# Patient Record
Sex: Male | Born: 1985 | Race: Black or African American | Hispanic: No | Marital: Single | Smoking: Never smoker
Health system: Southern US, Community
[De-identification: ages and names within clinical notes are randomized; demographics above are authoritative.]

## PROBLEM LIST (undated history)

## (undated) HISTORY — PX: HERNIA REPAIR: SHX51

---

## 2011-05-07 ENCOUNTER — Encounter: Payer: Self-pay | Admitting: *Deleted

## 2011-05-07 ENCOUNTER — Emergency Department (HOSPITAL_BASED_OUTPATIENT_CLINIC_OR_DEPARTMENT_OTHER)
Admission: EM | Admit: 2011-05-07 | Discharge: 2011-05-07 | Disposition: A | Discharge status: Home / Self Care | Payer: Self-pay | Attending: Emergency Medicine | Admitting: Emergency Medicine

## 2011-05-07 DIAGNOSIS — R112 Nausea with vomiting, unspecified: Secondary | ICD-10-CM | POA: Insufficient documentation

## 2011-05-07 DIAGNOSIS — R51 Headache: Secondary | ICD-10-CM | POA: Insufficient documentation

## 2011-05-07 MED ORDER — METOCLOPRAMIDE HCL 5 MG/ML IJ SOLN
10.0000 mg | Freq: Once | INTRAMUSCULAR | Status: AC
  Administered 2011-05-07: 10 mg via INTRAMUSCULAR
  Filled 2011-05-07: qty 2

## 2011-05-07 MED ORDER — DIPHENHYDRAMINE HCL 50 MG/ML IJ SOLN
25.0000 mg | Freq: Once | INTRAMUSCULAR | Status: AC
  Administered 2011-05-07: 25 mg via INTRAMUSCULAR
  Filled 2011-05-07: qty 1

## 2011-05-07 MED ORDER — KETOROLAC TROMETHAMINE 60 MG/2ML IM SOLN
60.0000 mg | Freq: Once | INTRAMUSCULAR | Status: AC
Start: 2011-05-07 — End: 2011-05-07
  Administered 2011-05-07: 60 mg via INTRAMUSCULAR
  Filled 2011-05-07: qty 2

## 2011-05-07 NOTE — ED Notes (Signed)
Pt c/o " migraine" x 1 day with n/v light sensativity

## 2011-05-07 NOTE — ED Provider Notes (Signed)
History     CSN: [id number] Arrival date & time: 05/07/2011  5:34 PM   First MD Initiated Contact with Patient 05/07/11 1749      Chief Complaint  Patient presents with  . Migraine    (Consider location/radiation/quality/duration/timing/severity/associated sxs/prior treatment) HPI Comments: Pt has history of similar symptoms:pt has not taken anything  Patient is a 25 y.o. male presenting with migraine. The history is provided by the patient. No language interpreter was used.  Migraine This is a recurrent problem. The current episode started today. The problem occurs constantly. The problem has been unchanged. Associated symptoms include nausea and vomiting. Pertinent negatives include no visual change. He has tried nothing for the symptoms.  Migraine This is a recurrent problem. The current episode started today. The problem occurs constantly. The problem has been unchanged. He has tried nothing for the symptoms.    Past Medical History  Diagnosis Date  . Migraine     Past Surgical History  Procedure Date  . Hernia repair     History reviewed. No pertinent family history.  History  Substance Use Topics  . Smoking status: Not on file  . Smokeless tobacco: Not on file  . Alcohol Use:       Review of Systems  Gastrointestinal: Positive for nausea and vomiting.  All other systems reviewed and are negative.    Allergies  Review of patient's allergies indicates no known allergies.  Home Medications  No current outpatient prescriptions on file.  BP 130/97  Pulse 65  Temp(Src) 98.7 F (37.1 C) (Oral)  Resp 16  Ht 6\' 3"  (1.905 m)  Wt 250 lb (113.399 kg)  BMI 31.25 kg/m2  SpO2 100%  Physical Exam  Nursing note and vitals reviewed. Constitutional: He is oriented to person, place, and time. He appears well-developed and well-nourished.  HENT:  Head: Normocephalic and atraumatic.  Right Ear: External ear normal.  Left Ear: External ear normal.    Mouth/Throat: Oropharynx is clear and moist.  Eyes: Conjunctivae and EOM are normal. Pupils are equal, round, and reactive to light.  Neck: Normal range of motion. Neck supple.  Cardiovascular: Normal rate and regular rhythm.   Pulmonary/Chest: Effort normal and breath sounds normal.  Abdominal: Soft. Bowel sounds are normal.  Musculoskeletal: Normal range of motion.  Neurological: He is alert and oriented to person, place, and time.  Skin: Skin is warm and dry.  Psychiatric: He has a normal mood and affect.    ED Course  Procedures (including critical care time)  Labs Reviewed - No data to display No results found.   1. Headache       MDM  Pt is feeling better and is ready to go home at this time   Medical screening examination/treatment/procedure(s) were performed by non-physician practitioner and as supervising physician I was immediately available for consultation/collaboration. Osvaldo Human, M.D.     Teressa Lower, NP 05/07/11 1924  Carleene Cooper III, MD 05/08/11 [phone]

## 2011-07-24 ENCOUNTER — Emergency Department (HOSPITAL_BASED_OUTPATIENT_CLINIC_OR_DEPARTMENT_OTHER)
Admission: EM | Admit: 2011-07-24 | Discharge: 2011-07-24 | Disposition: A | Discharge status: Home / Self Care | Payer: Self-pay | Attending: Emergency Medicine | Admitting: Emergency Medicine

## 2011-07-24 ENCOUNTER — Encounter (HOSPITAL_BASED_OUTPATIENT_CLINIC_OR_DEPARTMENT_OTHER): Payer: Self-pay | Admitting: *Deleted

## 2011-07-24 DIAGNOSIS — L0201 Cutaneous abscess of face: Secondary | ICD-10-CM | POA: Insufficient documentation

## 2011-07-24 DIAGNOSIS — L03211 Cellulitis of face: Secondary | ICD-10-CM | POA: Insufficient documentation

## 2011-07-24 MED ORDER — SULFAMETHOXAZOLE-TRIMETHOPRIM 800-160 MG PO TABS: 1.0000 | ORAL_TABLET | Freq: Two times a day (BID) | ORAL | Status: DC

## 2011-07-24 MED ORDER — SULFAMETHOXAZOLE-TRIMETHOPRIM 800-160 MG PO TABS: 1.0000 | ORAL_TABLET | Freq: Two times a day (BID) | ORAL | Status: AC

## 2011-07-24 MED ORDER — LIDOCAINE HCL 2 % IJ SOLN
INTRAMUSCULAR | Status: AC
  Filled 2011-07-24: qty 1

## 2011-07-24 MED ORDER — CEPHALEXIN 500 MG PO CAPS: 500.0000 mg | ORAL_CAPSULE | Freq: Four times a day (QID) | ORAL | Status: AC

## 2011-07-24 NOTE — ED Provider Notes (Signed)
History     CSN: [id number]  Arrival date & time 07/24/11  1056   First MD Initiated Contact with Patient 07/24/11 1121      Chief Complaint  Patient presents with  . Abscess    (Consider location/radiation/quality/duration/timing/severity/associated sxs/prior treatment) Patient is a 26 y.o. male presenting with abscess. The history is provided by the patient.  Abscess  This is a new problem. The current episode started less than one week ago. The onset was gradual. The problem occurs continuously. The problem has been gradually worsening. The abscess is present on the face. The problem is moderate. The abscess is characterized by redness and swelling. The abscess first occurred at home.    Past Medical History  Diagnosis Date  . Migraine     Past Surgical History  Procedure Date  . Hernia repair     No family history on file.  History  Substance Use Topics  . Smoking status: Never Smoker   . Smokeless tobacco: Not on file  . Alcohol Use: Yes      Review of Systems  All other systems reviewed and are negative.    Allergies  Review of patient's allergies indicates no known allergies.  Home Medications  No current outpatient prescriptions on file.  BP 140/95  Pulse 80  Temp(Src) 97.7 F (36.5 C) (Oral)  Resp 20  SpO2 99%  Physical Exam  Nursing note and vitals reviewed. Constitutional: He is oriented to person, place, and time. He appears well-developed and well-nourished.  HENT:  Head: Normocephalic and atraumatic.  Neck: Normal range of motion. Neck supple.  Musculoskeletal: Normal range of motion.  Neurological: He is alert and oriented to person, place, and time.  Skin:       There is a 2 cm round fluctuant area to the left cheek adjacent to the corner of the mouth.    ED Course  Procedures (including critical care time)  Labs Reviewed - No data to display No results found.   No diagnosis found.    MDM  Is draining serous fluid.   Will treat with keflex, bactrim, warm soaks.        Geoffery Lyons, MD 07/24/11 1155

## 2011-07-24 NOTE — ED Notes (Signed)
Abscess on his face x 2 weeks. Has been manually squeezing it and getting pus and blood from the site but it will not go away. Hx of I&D abscess to his buttocks in the past.

## 2012-07-28 ENCOUNTER — Emergency Department (HOSPITAL_BASED_OUTPATIENT_CLINIC_OR_DEPARTMENT_OTHER): Payer: Self-pay

## 2012-07-28 ENCOUNTER — Emergency Department (HOSPITAL_BASED_OUTPATIENT_CLINIC_OR_DEPARTMENT_OTHER)
Admission: EM | Admit: 2012-07-28 | Discharge: 2012-07-29 | Disposition: A | Discharge status: Home / Self Care | Payer: Self-pay | Attending: Emergency Medicine | Admitting: Emergency Medicine

## 2012-07-28 ENCOUNTER — Encounter (HOSPITAL_BASED_OUTPATIENT_CLINIC_OR_DEPARTMENT_OTHER): Payer: Self-pay | Admitting: *Deleted

## 2012-07-28 DIAGNOSIS — Y9367 Activity, basketball: Secondary | ICD-10-CM | POA: Insufficient documentation

## 2012-07-28 DIAGNOSIS — Y9239 Other specified sports and athletic area as the place of occurrence of the external cause: Secondary | ICD-10-CM | POA: Insufficient documentation

## 2012-07-28 DIAGNOSIS — X500XXA Overexertion from strenuous movement or load, initial encounter: Secondary | ICD-10-CM | POA: Insufficient documentation

## 2012-07-28 DIAGNOSIS — Z79899 Other long term (current) drug therapy: Secondary | ICD-10-CM | POA: Insufficient documentation

## 2012-07-28 DIAGNOSIS — Y92838 Other recreation area as the place of occurrence of the external cause: Secondary | ICD-10-CM | POA: Insufficient documentation

## 2012-07-28 DIAGNOSIS — S8990XA Unspecified injury of unspecified lower leg, initial encounter: Secondary | ICD-10-CM | POA: Insufficient documentation

## 2012-07-28 DIAGNOSIS — Z8679 Personal history of other diseases of the circulatory system: Secondary | ICD-10-CM | POA: Insufficient documentation

## 2012-07-28 DIAGNOSIS — M25561 Pain in right knee: Secondary | ICD-10-CM

## 2012-07-28 MED ORDER — MELOXICAM 7.5 MG PO TABS: 15.0000 mg | ORAL_TABLET | Freq: Every day | ORAL | Status: DC

## 2012-07-28 NOTE — ED Notes (Signed)
Patient transported to X-ray 

## 2012-07-28 NOTE — ED Notes (Signed)
Pt c/o right knee injury x 2 weeks ago

## 2012-07-29 NOTE — ED Provider Notes (Signed)
History     CSN: [id number]  Arrival date & time 07/28/12  2135   First MD Initiated Contact with Patient 07/28/12 2245      Chief Complaint  Patient presents with  . Knee Injury    (Consider location/radiation/quality/duration/timing/severity/associated sxs/prior treatment) HPI Comments: 27 year old male who presents with right knee pain. He states this was acute in onset 3 weeks ago while he was playing basketball. It happened while he was running, there were no sudden movements, no returns, no jumping, no strains. The initial pain was felt behind the knee, it was felt like a pop and since that time he has had difficulty bending his knee, there is increased pain with climbing stairs and descending stairs but no significant pain while he is walking on flat surface. Initially there was some swelling but this has resolved spontaneously and there is no redness or swelling of the knee. He denies any numbness or weakness of the leg. He has not been taking any medications but initially did ice the knee. The symptoms are intermittent, worse with movement, not associated with fevers or swelling at this time. Pain is mild  The history is provided by the patient.    Past Medical History  Diagnosis Date  . Migraine     Past Surgical History  Procedure Date  . Hernia repair     History reviewed. No pertinent family history.  History  Substance Use Topics  . Smoking status: Never Smoker   . Smokeless tobacco: Not on file  . Alcohol Use: Yes      Review of Systems  Constitutional: Negative for fever.  Musculoskeletal: Negative for joint swelling ( Initial swelling resolved spontaneously).  Skin: Negative for rash and wound.  Neurological: Negative for weakness and numbness.    Allergies  Review of patient's allergies indicates no known allergies.  Home Medications   Current Outpatient Rx  Name  Route  Sig  Dispense  Refill  . MELOXICAM 7.5 MG PO TABS   Oral   Take 2  tablets (15 mg total) by mouth daily.   30 tablet   0     BP 160/83  Pulse 65  Temp 98.1 F (36.7 C) (Oral)  Resp 16  Ht 6\' 3"  (1.905 m)  Wt 260 lb (117.935 kg)  BMI 32.50 kg/m2  SpO2 100%  Physical Exam  Nursing note and vitals reviewed. Constitutional: He appears well-developed and well-nourished. No distress.  HENT:  Head: Normocephalic and atraumatic.  Eyes: Conjunctivae normal are normal. No scleral icterus.  Cardiovascular: Normal rate, regular rhythm and intact distal pulses.   Pulmonary/Chest: Effort normal and breath sounds normal.  Musculoskeletal: He exhibits tenderness ( Mild tenderness with flexion and extension of the right knee, it is a stable joint without any drawer signs, stable with medial and lateral stress). He exhibits no edema.       Able to straight leg raise without difficulty  Neurological: He is alert.  Skin: Skin is warm and dry. No rash noted. He is not diaphoretic.    ED Course  Procedures (including critical care time)  Labs Reviewed - No data to display Dg Knee Complete 4 Views Right  07/28/2012  *RADIOLOGY REPORT*  Clinical Data: Right knee pain after injury.  RIGHT KNEE - COMPLETE 4+ VIEW  Comparison: None.  Findings: There is no evidence of fracture or dislocation.  The joint spaces are preserved.  No significant degenerative change is seen; the patellofemoral joint is grossly unremarkable in appearance.  No significant joint effusion is seen.  The visualized soft tissues are normal in appearance.  IMPRESSION: No evidence of fracture or dislocation.   Original Report Authenticated By: Tonia Ghent, M.D.      1. Pain in right knee       MDM  The patient appears stable, x-rays performed in the emergency department show no signs of fractures or dislocations. I suspect that he has a poor meniscus or minor intra-articular injury. He has a stable joint and is ambulating without difficulty. He will be referred to followup with orthopedics  sports medicine with Dr. Pearletha Forge at the patient's request.  Anti-inflammatories for home.        Vida Roller, MD 07/29/12 [phone]

## 2013-08-09 ENCOUNTER — Emergency Department (HOSPITAL_BASED_OUTPATIENT_CLINIC_OR_DEPARTMENT_OTHER)
Admission: EM | Admit: 2013-08-09 | Discharge: 2013-08-09 | Disposition: A | Discharge status: Home / Self Care | Payer: Self-pay | Attending: Emergency Medicine | Admitting: Emergency Medicine

## 2013-08-09 ENCOUNTER — Encounter (HOSPITAL_BASED_OUTPATIENT_CLINIC_OR_DEPARTMENT_OTHER): Payer: Self-pay | Admitting: Emergency Medicine

## 2013-08-09 DIAGNOSIS — Z791 Long term (current) use of non-steroidal anti-inflammatories (NSAID): Secondary | ICD-10-CM | POA: Insufficient documentation

## 2013-08-09 DIAGNOSIS — Z8679 Personal history of other diseases of the circulatory system: Secondary | ICD-10-CM | POA: Insufficient documentation

## 2013-08-09 DIAGNOSIS — H109 Unspecified conjunctivitis: Secondary | ICD-10-CM | POA: Insufficient documentation

## 2013-08-09 MED ORDER — TOBRAMYCIN 0.3 % OP SOLN: 2.0000 [drp] | OPHTHALMIC | Status: DC

## 2013-08-09 NOTE — ED Notes (Signed)
C/o redness and pain to both eyes-started in right eye 4 days ago

## 2013-08-09 NOTE — ED Provider Notes (Signed)
CSN: [id number]     Arrival date & time 08/09/13  1840 History   First MD Initiated Contact with Patient 08/09/13 2050     Chief Complaint  Patient presents with  . Eye Pain   (Consider location/radiation/quality/duration/timing/severity/associated sxs/prior Treatment) Patient is a 28 y.o. male presenting with eye pain. The history is provided by the patient. No language interpreter was used.  Eye Pain This is a new problem. The current episode started today. The problem occurs constantly. The problem has been gradually worsening. Pertinent negatives include no coughing or fever. Nothing aggravates the symptoms. He has tried nothing for the symptoms. The treatment provided moderate relief.  Pt complains bilat eye redness  Past Medical History  Diagnosis Date  . Migraine    Past Surgical History  Procedure Laterality Date  . Hernia repair     No family history on file. History  Substance Use Topics  . Smoking status: Never Smoker   . Smokeless tobacco: Not on file  . Alcohol Use: Yes    Review of Systems  Constitutional: Negative for fever.  Eyes: Positive for pain.  Respiratory: Negative for cough.   All other systems reviewed and are negative.    Allergies  Review of patient's allergies indicates no known allergies.  Home Medications   Current Outpatient Rx  Name  Route  Sig  Dispense  Refill  . meloxicam (MOBIC) 7.5 MG tablet   Oral   Take 2 tablets (15 mg total) by mouth daily.   30 tablet   0   . tobramycin (TOBREX) 0.3 % ophthalmic solution   Both Eyes   Place 2 drops into both eyes every 4 (four) hours.   5 mL   0    BP 142/83  Pulse 74  Temp(Src) 98.5 F (36.9 C) (Oral)  Resp 18  Ht 6\' 3"  (1.905 m)  Wt 244 lb (110.678 kg)  BMI 30.50 kg/m2  SpO2 98% Physical Exam  Constitutional: He is oriented to person, place, and time. He appears well-developed and well-nourished.  HENT:  Head: Normocephalic and atraumatic.  Eyes:  Injected bilat  conjunctiva  Neck: Normal range of motion. Neck supple.  Cardiovascular: Normal rate.   Pulmonary/Chest: Effort normal.  Abdominal: Soft.  Musculoskeletal: Normal range of motion.  Neurological: He is alert and oriented to person, place, and time. He has normal reflexes.  Skin: Skin is warm.  Psychiatric: He has a normal mood and affect.    ED Course  Procedures (including critical care time) Labs Review Labs Reviewed - No data to display Imaging Review No results found.  EKG Interpretation   None       MDM   1. Conjunctivitis    Tobrex opth    Lonia SkinnerLeslie K Goodyears BarSofia, [place] JerseyPA-C 08/09/13 2111

## 2013-08-09 NOTE — ED Notes (Addendum)
Rt Eye redness x 4 days, left eye onset today, painful,    P;t took out contacts today

## 2013-08-09 NOTE — Discharge Instructions (Signed)
Bacterial Conjunctivitis  Bacterial conjunctivitis, commonly called pink eye, is an inflammation of the clear membrane that covers the white part of the eye (conjunctiva). The inflammation can also happen on the underside of the eyelids. The blood vessels in the conjunctiva become inflamed causing the eye to become red or pink. Bacterial conjunctivitis may spread easily from one eye to another and from person to person (contagious).   CAUSES   Bacterial conjunctivitis is caused by bacteria. The bacteria may come from your own skin, your upper respiratory tract, or from someone else with bacterial conjunctivitis.  SYMPTOMS   The normally white color of the eye or the underside of the eyelid is usually pink or red. The pink eye is usually associated with irritation, tearing, and some sensitivity to light. Bacterial conjunctivitis is often associated with a thick, yellowish discharge from the eye. The discharge may turn into a crust on the eyelids overnight, which causes your eyelids to stick together. If a discharge is present, there may also be some blurred vision in the affected eye.  DIAGNOSIS   Bacterial conjunctivitis is diagnosed by your caregiver through an eye exam and the symptoms that you report. Your caregiver looks for changes in the surface tissues of your eyes, which may point to the specific type of conjunctivitis. A sample of any discharge may be collected on a cotton-tip swab if you have a severe case of conjunctivitis, if your cornea is affected, or if you keep getting repeat infections that do not respond to treatment. The sample will be sent to a lab to see if the inflammation is caused by a bacterial infection and to see if the infection will respond to antibiotic medicines.  TREATMENT   · Bacterial conjunctivitis is treated with antibiotics. Antibiotic eyedrops are most often used. However, antibiotic ointments are also available. Antibiotics pills are sometimes used. Artificial tears or eye  washes may ease discomfort.  HOME CARE INSTRUCTIONS   · To ease discomfort, apply a cool, clean wash cloth to your eye for 10 20 minutes, 3 4 times a day.  · Gently wipe away any drainage from your eye with a warm, wet washcloth or a cotton ball.  · Wash your hands often with soap and water. Use paper towels to dry your hands.  · Do not share towels or wash cloths. This may spread the infection.  · Change or wash your pillow case every day.  · You should not use eye makeup until the infection is gone.  · Do not operate machinery or drive if your vision is blurred.  · Stop using contacts lenses. Ask your caregiver how to sterilize or replace your contacts before using them again. This depends on the type of contact lenses that you use.  · When applying medicine to the infected eye, do not touch the edge of your eyelid with the eyedrop bottle or ointment tube.  SEEK IMMEDIATE MEDICAL CARE IF:   · Your infection has not improved within 3 days after beginning treatment.  · You had yellow discharge from your eye and it returns.  · You have increased eye pain.  · Your eye redness is spreading.  · Your vision becomes blurred.  · You have a fever or persistent symptoms for more than 2 3 days.  · You have a fever and your symptoms suddenly get worse.  · You have facial pain, redness, or swelling.  MAKE SURE YOU:   · Understand these instructions.  · Will watch your   condition.  · Will get help right away if you are not doing well or get worse.  Document Released: 06/22/2005 Document Revised: 03/16/2012 Document Reviewed: 11/23/2011  [employer]® Patient Information ©2014 [employer].

## 2013-08-09 NOTE — ED Provider Notes (Signed)
Medical screening examination/treatment/procedure(s) were performed by non-physician practitioner and as supervising physician I was immediately available for consultation/collaboration.  EKG Interpretation   None         Charles B. Sheldon, MD 08/09/13 2149 

## 2015-04-09 ENCOUNTER — Emergency Department (HOSPITAL_BASED_OUTPATIENT_CLINIC_OR_DEPARTMENT_OTHER)
Admission: EM | Admit: 2015-04-09 | Discharge: 2015-04-09 | Disposition: A | Discharge status: Home / Self Care | Payer: Self-pay | Attending: Emergency Medicine | Admitting: Emergency Medicine

## 2015-04-09 ENCOUNTER — Encounter (HOSPITAL_BASED_OUTPATIENT_CLINIC_OR_DEPARTMENT_OTHER): Payer: Self-pay | Admitting: Emergency Medicine

## 2015-04-09 DIAGNOSIS — R109 Unspecified abdominal pain: Secondary | ICD-10-CM | POA: Insufficient documentation

## 2015-04-09 DIAGNOSIS — K521 Toxic gastroenteritis and colitis: Secondary | ICD-10-CM

## 2015-04-09 DIAGNOSIS — Z8679 Personal history of other diseases of the circulatory system: Secondary | ICD-10-CM | POA: Insufficient documentation

## 2015-04-09 DIAGNOSIS — Z791 Long term (current) use of non-steroidal anti-inflammatories (NSAID): Secondary | ICD-10-CM | POA: Insufficient documentation

## 2015-04-09 DIAGNOSIS — R197 Diarrhea, unspecified: Secondary | ICD-10-CM | POA: Insufficient documentation

## 2015-04-09 DIAGNOSIS — T378X5A Adverse effect of other specified systemic anti-infectives and antiparasitics, initial encounter: Secondary | ICD-10-CM | POA: Insufficient documentation

## 2015-04-09 LAB — CBC WITH DIFFERENTIAL/PLATELET
BASOS PCT: 0 %
Basophils Absolute: 0 10*3/uL (ref 0.0–0.1)
EOS ABS: 0.3 10*3/uL (ref 0.0–0.7)
Eosinophils Relative: 3 %
HEMATOCRIT: 41.2 % (ref 39.0–52.0)
Hemoglobin: 13.7 g/dL (ref 13.0–17.0)
LYMPHS ABS: 2.1 10*3/uL (ref 0.7–4.0)
LYMPHS PCT: 23 %
MCH: 26 pg (ref 26.0–34.0)
MCHC: 33.3 g/dL (ref 30.0–36.0)
MCV: 78.2 fL (ref 78.0–100.0)
MONOS PCT: 14 %
Monocytes Absolute: 1.3 10*3/uL — ABNORMAL HIGH (ref 0.1–1.0)
NEUTROS ABS: 5.5 10*3/uL (ref 1.7–7.7)
Neutrophils Relative %: 60 %
PLATELETS: 323 10*3/uL (ref 150–400)
RBC: 5.27 MIL/uL (ref 4.22–5.81)
RDW: 13.2 % (ref 11.5–15.5)
WBC: 9.2 10*3/uL (ref 4.0–10.5)

## 2015-04-09 LAB — COMPREHENSIVE METABOLIC PANEL
ALT: 17 U/L (ref 17–63)
ANION GAP: 6 (ref 5–15)
AST: 19 U/L (ref 15–41)
Albumin: 3.9 g/dL (ref 3.5–5.0)
Alkaline Phosphatase: 50 U/L (ref 38–126)
BILIRUBIN TOTAL: 0.6 mg/dL (ref 0.3–1.2)
BUN: 7 mg/dL (ref 6–20)
CO2: 26 mmol/L (ref 22–32)
Calcium: 8.9 mg/dL (ref 8.9–10.3)
Chloride: 107 mmol/L (ref 101–111)
Creatinine, Ser: 1.2 mg/dL (ref 0.61–1.24)
Glucose, Bld: 104 mg/dL — ABNORMAL HIGH (ref 65–99)
POTASSIUM: 3.8 mmol/L (ref 3.5–5.1)
Sodium: 139 mmol/L (ref 135–145)
TOTAL PROTEIN: 7.6 g/dL (ref 6.5–8.1)

## 2015-04-09 LAB — URINALYSIS, ROUTINE W REFLEX MICROSCOPIC
Bilirubin Urine: NEGATIVE
GLUCOSE, UA: NEGATIVE mg/dL
HGB URINE DIPSTICK: NEGATIVE
Ketones, ur: NEGATIVE mg/dL
NITRITE: NEGATIVE
PH: 6 (ref 5.0–8.0)
Protein, ur: NEGATIVE mg/dL
Specific Gravity, Urine: 1.015 (ref 1.005–1.030)
UROBILINOGEN UA: 0.2 mg/dL (ref 0.0–1.0)

## 2015-04-09 LAB — URINE MICROSCOPIC-ADD ON

## 2015-04-09 LAB — LIPASE, BLOOD: LIPASE: 42 U/L (ref 22–51)

## 2015-04-09 MED ORDER — ONDANSETRON HCL 4 MG/2ML IJ SOLN
4.0000 mg | Freq: Once | INTRAMUSCULAR | Status: AC
  Administered 2015-04-09: 4 mg via INTRAVENOUS
  Filled 2015-04-09: qty 2

## 2015-04-09 MED ORDER — MORPHINE SULFATE (PF) 4 MG/ML IV SOLN
6.0000 mg | Freq: Once | INTRAVENOUS | Status: AC
  Administered 2015-04-09: 6 mg via INTRAVENOUS
  Filled 2015-04-09: qty 2

## 2015-04-09 MED ORDER — SODIUM CHLORIDE 0.9 % IV BOLUS (SEPSIS)
1000.0000 mL | Freq: Once | INTRAVENOUS | Status: AC
  Administered 2015-04-09: 1000 mL via INTRAVENOUS

## 2015-04-09 NOTE — Discharge Instructions (Signed)
Please avoid taken antibiotic when not prescribed to you as it can cause diarrhea. Drink plenty of fluid and stay hydrated.  Return to ER if your condition worsen or if you have other concerns.  Diarrhea Diarrhea is frequent loose and watery bowel movements. It can cause you to feel weak and dehydrated. Dehydration can cause you to become tired and thirsty, have a dry mouth, and have decreased urination that often is dark yellow. Diarrhea is a sign of another problem, most often an infection that will not last long. In most cases, diarrhea typically lasts 2-3 days. However, it can last longer if it is a sign of something more serious. It is important to treat your diarrhea as directed by your caregiver to lessen or prevent future episodes of diarrhea. CAUSES  Some common causes include:  Gastrointestinal infections caused by viruses, bacteria, or parasites.  Food poisoning or food allergies.  Certain medicines, such as antibiotics, chemotherapy, and laxatives.  Artificial sweeteners and fructose.  Digestive disorders. HOME CARE INSTRUCTIONS  Ensure adequate fluid intake (hydration): Have 1 cup (8 oz) of fluid for each diarrhea episode. Avoid fluids that contain simple sugars or sports drinks, fruit juices, whole milk products, and sodas. Your urine should be clear or pale yellow if you are drinking enough fluids. Hydrate with an oral rehydration solution that you can purchase at pharmacies, retail stores, and online. You can prepare an oral rehydration solution at home by mixing the following ingredients together:   - tsp table salt.   tsp baking soda.   tsp salt substitute containing potassium chloride.  1  tablespoons sugar.  1 L (34 oz) of water.  Certain foods and beverages may increase the speed at which food moves through the gastrointestinal (GI) tract. These foods and beverages should be avoided and include:  Caffeinated and alcoholic beverages.  High-fiber foods, such as  raw fruits and vegetables, nuts, seeds, and whole grain breads and cereals.  Foods and beverages sweetened with sugar alcohols, such as xylitol, sorbitol, and mannitol.  Some foods may be well tolerated and may help thicken stool including:  Starchy foods, such as rice, toast, pasta, low-sugar cereal, oatmeal, grits, baked potatoes, crackers, and bagels.  Bananas.  Applesauce.  Add probiotic-rich foods to help increase healthy bacteria in the GI tract, such as yogurt and fermented milk products.  Wash your hands well after each diarrhea episode.  Only take over-the-counter or prescription medicines as directed by your caregiver.  Take a warm bath to relieve any burning or pain from frequent diarrhea episodes. SEEK IMMEDIATE MEDICAL CARE IF:   You are unable to keep fluids down.  You have persistent vomiting.  You have blood in your stool, or your stools are black and tarry.  You do not urinate in 6-8 hours, or there is only a small amount of very dark urine.  You have abdominal pain that increases or localizes.  You have weakness, dizziness, confusion, or light-headedness.  You have a severe headache.  Your diarrhea gets worse or does not get better.  You have a fever or persistent symptoms for more than 2-3 days.  You have a fever and your symptoms suddenly get worse. MAKE SURE YOU:   Understand these instructions.  Will watch your condition.  Will get help right away if you are not doing well or get worse. Document Released: 06/12/2002 Document Revised: 11/06/2013 Document Reviewed: 02/28/2012 [facility] Patient Information 2015 [place], [place]. This information is not intended to replace advice given to  you by your health care provider. Make sure you discuss any questions you have with your health care provider.

## 2015-04-09 NOTE — ED Notes (Signed)
The patient reports that he took an antibiotic about 1 weeks ago , and the patient reports that he has since had a stomach pain. The patient reports that he is able to drink but can not eat anything because he has pain.

## 2015-04-09 NOTE — ED Provider Notes (Signed)
CSN: [id number]     Arrival date & time 04/09/15  1517 History   First MD Initiated Contact with Patient 04/09/15 1530     Chief Complaint  Patient presents with  . Abdominal Pain     (Consider location/radiation/quality/duration/timing/severity/associated sxs/prior Treatment) HPI   29 year old male with history of migraine presents for evaluation of abdominal pain. Patient reports 10 days ago he developed headache, fever, and abdominal cramping. He decided to take his girlfriend's antibiotic, metronidazole for 3 days hoping it will treat his infection. Patient report his fever broke however since taking the antibiotic he has had worsening abdominal pain. He described pain as a squeezing sensation worsening with eating and also reported having recurrent diarrhea. States he has  6-8 bouts of loose stools per day and yesterday he also notice some blood mixed with the stools which concerns him. He is afraid to eat anything as it worsen his abdominal pain. He denies having had fever, headache, lightheadedness, dizziness, chest pain, difficulty breathing, back pain, dysuria, hematuria, black tarry stool, or rash. He admits to drinking alcohol but has not drank any alcohol in the past 2 weeks. He denies any recent travel. Report that his abdominal pain is mild to moderate at this time.  Past Medical History  Diagnosis Date  . Migraine    Past Surgical History  Procedure Laterality Date  . Hernia repair     History reviewed. No pertinent family history. Social History  Substance Use Topics  . Smoking status: Never Smoker   . Smokeless tobacco: None  . Alcohol Use: Yes    Review of Systems  All other systems reviewed and are negative.     Allergies  Review of patient's allergies indicates no known allergies.  Home Medications   Prior to Admission medications   Medication Sig Start Date End Date Taking? Authorizing Provider  meloxicam (MOBIC) 7.5 MG tablet Take 2 tablets (15 mg  total) by mouth daily. 07/28/12   Eber Hong, MD  tobramycin (TOBREX) 0.3 % ophthalmic solution Place 2 drops into both eyes every 4 (four) hours. 08/09/13   Lonia Skinner Sofia, PA-C   BP 140/93 mmHg  Pulse 76  Temp(Src) 98 F (36.7 C) (Oral)  Resp 16  Ht  (1.905 m)  Wt 252 lb (114.306 kg)  BMI 31.50 kg/m2  SpO2 97% Physical Exam  Constitutional: He appears well-developed and well-nourished. No distress.  HENT:  Head: Atraumatic.  Eyes: Conjunctivae are normal.  Neck: Neck supple.  Cardiovascular: Normal rate and regular rhythm.   Pulmonary/Chest: Effort normal and breath sounds normal.  Abdominal: Soft. Bowel sounds are normal. He exhibits no distension. There is tenderness (Mild diffuse abdominal tenderness without guarding or rebound tenderness. Negative Murphy sign, no pain at McBurney's point.).  Genitourinary:  No CVA tenderness  Neurological: He is alert.  Skin: No rash noted.  Psychiatric: He has a normal mood and affect.  Nursing note and vitals reviewed.   ED Course  Procedures (including critical care time)  Patient presents with recurrent diarrhea and abdominal cramping after taking metronidazole for approximately 3 days. He has not been on antibiotic for the past week. Workup initiated. Low suspicion for C. Difficile.  5:21 PM Patient is afebrile with stable normal vital sign. He is well appearing. Labs are reassuring. No diarrhea during the ER visit. Patient made aware to avoid taking antibiotic that is not prescribed directly to him. Recommend hydration and rest. Return precautions discussed.  Labs Review Labs Reviewed  CBC WITH  DIFFERENTIAL/PLATELET - Abnormal; Notable for the following:    Monocytes Absolute 1.3 (*)    All other components within normal limits  COMPREHENSIVE METABOLIC PANEL - Abnormal; Notable for the following:    Glucose, Bld 104 (*)    All other components within normal limits  URINALYSIS, ROUTINE W REFLEX MICROSCOPIC (NOT AT [facility] -  Abnormal; Notable for the following:    Leukocytes, UA TRACE (*)    All other components within normal limits  LIPASE, BLOOD  URINE MICROSCOPIC-ADD ON    Imaging Review No results found. I have personally reviewed and evaluated these images and lab results as part of my medical decision-making.   EKG Interpretation None      MDM   Final diagnoses:  Diarrhea due to drug    BP 140/93 mmHg  Pulse 76  Temp(Src) 98 F (36.7 C) (Oral)  Resp 16  Ht  (1.905 m)  Wt 252 lb (114.306 kg)  BMI 31.50 kg/m2  SpO2 97%     Fayrene Helper, PA-C 04/09/15 1722  Tilden Fossa, MD 04/09/15 1806

## 2015-05-23 ENCOUNTER — Emergency Department (HOSPITAL_COMMUNITY)
Admission: EM | Admit: 2015-05-23 | Discharge: 2015-05-23 | Discharge status: Against Medical Advice | Payer: Self-pay | Attending: Emergency Medicine | Admitting: Emergency Medicine

## 2015-05-23 ENCOUNTER — Emergency Department (HOSPITAL_COMMUNITY): Payer: Self-pay

## 2015-05-23 ENCOUNTER — Encounter (HOSPITAL_COMMUNITY): Payer: Self-pay

## 2015-05-23 DIAGNOSIS — R079 Chest pain, unspecified: Secondary | ICD-10-CM | POA: Insufficient documentation

## 2015-05-23 LAB — BASIC METABOLIC PANEL
ANION GAP: 4 — AB (ref 5–15)
BUN: 8 mg/dL (ref 6–20)
CHLORIDE: 106 mmol/L (ref 101–111)
CO2: 28 mmol/L (ref 22–32)
Calcium: 9.4 mg/dL (ref 8.9–10.3)
Creatinine, Ser: 1.14 mg/dL (ref 0.61–1.24)
Glucose, Bld: 95 mg/dL (ref 65–99)
POTASSIUM: 4.1 mmol/L (ref 3.5–5.1)
SODIUM: 138 mmol/L (ref 135–145)

## 2015-05-23 LAB — I-STAT TROPONIN, ED: Troponin i, poc: 0 ng/mL (ref 0.00–0.08)

## 2015-05-23 LAB — CBC
HEMATOCRIT: 42.8 % (ref 39.0–52.0)
HEMOGLOBIN: 14 g/dL (ref 13.0–17.0)
MCH: 26.2 pg (ref 26.0–34.0)
MCHC: 32.7 g/dL (ref 30.0–36.0)
MCV: 80 fL (ref 78.0–100.0)
Platelets: 311 10*3/uL (ref 150–400)
RBC: 5.35 MIL/uL (ref 4.22–5.81)
RDW: 13.7 % (ref 11.5–15.5)
WBC: 7.4 10*3/uL (ref 4.0–10.5)

## 2015-05-23 NOTE — ED Notes (Signed)
Pt here for right sided cramping chest pain onset yesterday around 2100. Denies N/V/D/SOB but reports pain worse with deep inspiration.

## 2015-07-31 ENCOUNTER — Emergency Department (HOSPITAL_COMMUNITY): Payer: Self-pay

## 2015-07-31 ENCOUNTER — Emergency Department (HOSPITAL_COMMUNITY)
Admission: EM | Admit: 2015-07-31 | Discharge: 2015-07-31 | Disposition: A | Discharge status: Home / Self Care | Payer: Self-pay | Attending: Emergency Medicine | Admitting: Emergency Medicine

## 2015-07-31 ENCOUNTER — Encounter (HOSPITAL_COMMUNITY): Payer: Self-pay

## 2015-07-31 DIAGNOSIS — Z8679 Personal history of other diseases of the circulatory system: Secondary | ICD-10-CM | POA: Insufficient documentation

## 2015-07-31 DIAGNOSIS — R079 Chest pain, unspecified: Secondary | ICD-10-CM | POA: Insufficient documentation

## 2015-07-31 LAB — BASIC METABOLIC PANEL
ANION GAP: 9 (ref 5–15)
BUN: 10 mg/dL (ref 6–20)
CALCIUM: 9.2 mg/dL (ref 8.9–10.3)
CO2: 22 mmol/L (ref 22–32)
Chloride: 105 mmol/L (ref 101–111)
Creatinine, Ser: 1.2 mg/dL (ref 0.61–1.24)
GLUCOSE: 103 mg/dL — AB (ref 65–99)
Potassium: 3.7 mmol/L (ref 3.5–5.1)
SODIUM: 136 mmol/L (ref 135–145)

## 2015-07-31 LAB — CBC
HCT: 39.7 % (ref 39.0–52.0)
HEMOGLOBIN: 13.1 g/dL (ref 13.0–17.0)
MCH: 25.9 pg — ABNORMAL LOW (ref 26.0–34.0)
MCHC: 33 g/dL (ref 30.0–36.0)
MCV: 78.5 fL (ref 78.0–100.0)
Platelets: 318 10*3/uL (ref 150–400)
RBC: 5.06 MIL/uL (ref 4.22–5.81)
RDW: 13.5 % (ref 11.5–15.5)
WBC: 6.9 10*3/uL (ref 4.0–10.5)

## 2015-07-31 LAB — I-STAT TROPONIN, ED: TROPONIN I, POC: 0 ng/mL (ref 0.00–0.08)

## 2015-07-31 MED ORDER — NAPROXEN 250 MG PO TABS: 250.0000 mg | ORAL_TABLET | Freq: Two times a day (BID) | ORAL | Status: DC

## 2015-07-31 NOTE — Discharge Instructions (Signed)
Nonspecific Chest Pain  °Chest pain can be caused by many different conditions. There is always a chance that your pain could be related to something serious, such as a heart attack or a blood clot in your lungs. Chest pain can also be caused by conditions that are not life-threatening. If you have chest pain, it is very important to follow up with your health care provider. °CAUSES  °Chest pain can be caused by: °· Heartburn. °· Pneumonia or bronchitis. °· Anxiety or stress. °· Inflammation around your heart (pericarditis) or lung (pleuritis or pleurisy). °· A blood clot in your lung. °· A collapsed lung (pneumothorax). It can develop suddenly on its own (spontaneous pneumothorax) or from trauma to the chest. °· Shingles infection (varicella-zoster virus). °· Heart attack. °· Damage to the bones, muscles, and cartilage that make up your chest wall. This can include: °¨ Bruised bones due to injury. °¨ Strained muscles or cartilage due to frequent or repeated coughing or overwork. °¨ Fracture to one or more ribs. °¨ Sore cartilage due to inflammation (costochondritis). °RISK FACTORS  °Risk factors for chest pain may include: °· Activities that increase your risk for trauma or injury to your chest. °· Respiratory infections or conditions that cause frequent coughing. °· Medical conditions or overeating that can cause heartburn. °· Heart disease or family history of heart disease. °· Conditions or health behaviors that increase your risk of developing a blood clot. °· Having had chicken pox (varicella zoster). °SIGNS AND SYMPTOMS °Chest pain can feel like: °· Burning or tingling on the surface of your chest or deep in your chest. °· Crushing, pressure, aching, or squeezing pain. °· Dull or sharp pain that is worse when you move, cough, or take a deep breath. °· Pain that is also felt in your back, neck, shoulder, or arm, or pain that spreads to any of these areas. °Your chest pain may come and go, or it may stay  constant. °DIAGNOSIS °Lab tests or other studies may be needed to find the cause of your pain. Your health care provider may have you take a test called an ambulatory ECG (electrocardiogram). An ECG records your heartbeat patterns at the time the test is performed. You may also have other tests, such as: °· Transthoracic echocardiogram (TTE). During echocardiography, sound waves are used to create a picture of all of the heart structures and to look at how blood flows through your heart. °· Transesophageal echocardiogram (TEE). This is a more advanced imaging test that obtains images from inside your body. It allows your health care provider to see your heart in finer detail. °· Cardiac monitoring. This allows your health care provider to monitor your heart rate and rhythm in real time. °· Holter monitor. This is a portable device that records your heartbeat and can help to diagnose abnormal heartbeats. It allows your health care provider to track your heart activity for several days, if needed. °· Stress tests. These can be done through exercise or by taking medicine that makes your heart beat more quickly. °· Blood tests. °· Imaging tests. °TREATMENT  °Your treatment depends on what is causing your chest pain. Treatment may include: °· Medicines. These may include: °¨ Acid blockers for heartburn. °¨ Anti-inflammatory medicine. °¨ Pain medicine for inflammatory conditions. °¨ Antibiotic medicine, if an infection is present. °¨ Medicines to dissolve blood clots. °¨ Medicines to treat coronary artery disease. °· Supportive care for conditions that do not require medicines. This may include: °¨ Resting. °¨ Applying heat   or cold packs to injured areas. °¨ Limiting activities until pain decreases. °HOME CARE INSTRUCTIONS °· If you were prescribed an antibiotic medicine, finish it all even if you start to feel better. °· Avoid any activities that bring on chest pain. °· Do not use any tobacco products, including  cigarettes, chewing tobacco, or electronic cigarettes. If you need help quitting, ask your health care provider. °· Do not drink alcohol. °· Take medicines only as directed by your health care provider. °· Keep all follow-up visits as directed by your health care provider. This is important. This includes any further testing if your chest pain does not go away. °· If heartburn is the cause for your chest pain, you may be told to keep your head raised (elevated) while sleeping. This reduces the chance that acid will go from your stomach into your esophagus. °· Make lifestyle changes as directed by your health care provider. These may include: °¨ Getting regular exercise. Ask your health care provider to suggest some activities that are safe for you. °¨ Eating a heart-healthy diet. A registered dietitian can help you to learn healthy eating options. °¨ Maintaining a healthy weight. °¨ Managing diabetes, if necessary. °¨ Reducing stress. °SEEK MEDICAL CARE IF: °· Your chest pain does not go away after treatment. °· You have a rash with blisters on your chest. °· You have a fever. °SEEK IMMEDIATE MEDICAL CARE IF:  °· Your chest pain is worse. °· You have an increasing cough, or you cough up blood. °· You have severe abdominal pain. °· You have severe weakness. °· You faint. °· You have chills. °· You have sudden, unexplained chest discomfort. °· You have sudden, unexplained discomfort in your arms, back, neck, or jaw. °· You have shortness of breath at any time. °· You suddenly start to sweat, or your skin gets clammy. °· You feel nauseous or you vomit. °· You suddenly feel light-headed or dizzy. °· Your heart begins to beat quickly, or it feels like it is skipping beats. °These symptoms may represent a serious problem that is an emergency. Do not wait to see if the symptoms will go away. Get medical help right away. Call your local emergency services (911 in the [place] Do not drive yourself to the hospital. °  °This  information is not intended to replace advice given to you by your health care provider. Make sure you discuss any questions you have with your health care provider. °  °Document Released: 04/01/2005 Document Revised: 07/13/2014 Document Reviewed: 01/26/2014 °Elsevier Interactive Patient Education ©2016 [employer] ° °

## 2015-07-31 NOTE — ED Notes (Signed)
Pt comes from work via [facility], c/o central CP with no radiation and no other cardiac symptoms. PTA pt received 324 ASA and two nitro with no relief. Had episode similar to this about one month ago but left without getting results.

## 2015-07-31 NOTE — ED Provider Notes (Signed)
CSN: [id number]     Arrival date & time 07/31/15  0345 History   First MD Initiated Contact with Patient 07/31/15 0359     Chief Complaint  Patient presents with  . Chest Pain   Jeremy Joseph is a 30 y.o. male who presents the emergency department complaining of substernal chest pain starting about 3 hours prior to my evaluation. 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 He reports he was at work sitting down when he started having substernal left-sided chest pain that he described as a "catch." He reports his pain is worse with certain movements and with touching his chest. He received 324 mg of aspirin and 2 nitroglycerin by EMS prior to arrival. On my evaluation patient reports his pain is gradually resolved and he is currently chest pain-free. He denies having any shortness of breath or palpitations with this chest pain. He denies personal or close family history of MI. He denies personal or close family history of DVTs or PEs. He is not a smoker. He denies history of hyperlipidemia or hypertension. He denies fevers, coughing, wheezing, shortness of breath, palpitations, leg pain, leg swelling, recent long travel, abdominal pain, nausea, vomiting, lightheadedness, dizziness, rashes or weakness.  (Consider location/radiation/quality/duration/timing/severity/associated sxs/prior Treatment) HPI  Past Medical History  Diagnosis Date  . Migraine    Past Surgical History  Procedure Laterality Date  . Hernia repair     No family history on file. Social History  Substance Use Topics  . Smoking status: Never Smoker   . Smokeless tobacco: None  . Alcohol Use: Yes    Review of Systems  Constitutional: Negative for fever and chills.  HENT: Negative for congestion and sore throat.   Eyes: Negative for visual disturbance.  Respiratory: Negative for cough, shortness of breath and wheezing.   Cardiovascular: Positive for chest pain. Negative for  palpitations and leg swelling.  Gastrointestinal: Negative for nausea, vomiting, abdominal pain and diarrhea.  Genitourinary: Negative for dysuria.  Musculoskeletal: Negative for back pain and neck pain.  Skin: Negative for rash.  Neurological: Negative for dizziness, syncope, light-headedness and headaches.      Allergies  Review of patient's allergies indicates no known allergies.  Home Medications   Prior to Admission medications   Medication Sig Start Date End Date Taking? Authorizing Provider  naproxen (NAPROSYN) 250 MG tablet Take 1 tablet (250 mg total) by mouth 2 (two) times daily with a meal. 07/31/15   Everlene Farrier, PA-C   BP 116/79 mmHg  Pulse 52  Resp 17  Ht  (1.905 m)  Wt 116.121 kg  BMI 32.00 kg/m2  SpO2 96% Physical Exam  Constitutional: He appears well-developed and well-nourished. No distress.  Nontoxic appearing.  HENT:  Head: Normocephalic and atraumatic.  Mouth/Throat: Oropharynx is clear and moist.  Eyes: Conjunctivae are normal. Pupils are equal, round, and reactive to light. Right eye exhibits no discharge. Left eye exhibits no discharge.  Neck: Normal range of motion. Neck supple. No JVD present. No tracheal deviation present.  Cardiovascular: Normal rate, regular rhythm, normal heart sounds and intact distal pulses.  Exam reveals no gallop and no friction rub.   No murmur heard. Bilateral radial and posterior tibialis pulses are intact.  Pulmonary/Chest: Effort normal and breath sounds normal. No respiratory distress. He has no wheezes. He has no rales. He exhibits no tenderness.  Lungs are clear to auscultation bilaterally. Symmetric chest expansion bilaterally. No chest wall tenderness.  Abdominal: Soft. There  is no tenderness. There is no guarding.  Musculoskeletal: He exhibits no edema or tenderness.  No lower extremity edema or tenderness.  Lymphadenopathy:    He has no cervical adenopathy.  Neurological: He is alert. Coordination  normal.  Skin: Skin is warm and dry. No rash noted. He is not diaphoretic. No erythema. No pallor.  Psychiatric: He has a normal mood and affect. His behavior is normal.  Nursing note and vitals reviewed.   ED Course  Procedures (including critical care time) Labs Review Labs Reviewed  BASIC METABOLIC PANEL - Abnormal; Notable for the following:    Glucose, Bld 103 (*)    All other components within normal limits  CBC - Abnormal; Notable for the following:    MCH 25.9 (*)    All other components within normal limits  I-STAT TROPOININ, ED    Imaging Review Dg Chest 2 View  07/31/2015  CLINICAL DATA:  Acute onset of generalized chest pain. Initial encounter. EXAM: CHEST  2 VIEW COMPARISON:  Chest radiograph performed 05/23/2015 FINDINGS: The lungs are well-aerated and clear. There is no evidence of focal opacification, pleural effusion or pneumothorax. The heart is normal in size; the mediastinal contour is within normal limits. No acute osseous abnormalities are seen. IMPRESSION: No acute cardiopulmonary process seen. Electronically Signed   By: Roanna Raider M.D.   On: 07/31/2015 04:39   I have personally reviewed and evaluated these images and lab results as part of my medical decision-making.   EKG Interpretation None      Filed Vitals:   07/31/15 0347 07/31/15 0348 07/31/15 0400  BP:   116/79  Pulse:   52  Resp:   17  Height:   (1.905 m)   Weight:  116.121 kg   SpO2: 97%  96%     MDM   Meds given in ED:  Medications - No data to display  New Prescriptions   NAPROXEN (NAPROSYN) 250 MG TABLET    Take 1 tablet (250 mg total) by mouth 2 (two) times daily with a meal.    Final diagnoses:  Chest pain, unspecified chest pain type   This  is a 30 y.o. male who presents the emergency department complaining of substernal chest pain starting about 3 hours prior to my evaluation. 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 He  reports he was at work sitting down when he started having substernal left-sided chest pain that he described as a "catch." He reports his pain is worse with certain movements and with touching his chest. He received 324 mg of aspirin and 2 nitroglycerin by EMS prior to arrival. On my evaluation patient reports his pain is gradually resolved and he is currently chest pain-free. Patient presented with chest pain to the ED. Patient is to be discharged with recommendation to follow up with PCP in regards to today's hospital visit. Chest pain is not likely of cardiac or pulmonary etiology due to presentation, perc negative, VSS, no tracheal deviation, no JVD or new murmur, RRR, breath sounds equal bilaterally, EKG without acute abnormalities, negative troponin, and negative CXR. HEART score is 0. Patient has been advised to return to the ED if chest pain becomes exertional, associated with diaphoresis or nausea, radiates to left jaw/arm, worsens or becomes concerning in any way. Patient appears reliable for follow up and is agreeable to discharge. I advised the patient to follow-up with their primary care provider this week. I advised the patient  to return to the emergency department with new or worsening symptoms or new concerns. The patient verbalized understanding and agreement with plan.         Everlene Farrier, PA-C 07/31/15 4098  Devoria Albe, MD 07/31/15 [phone]

## 2016-05-12 ENCOUNTER — Emergency Department (HOSPITAL_BASED_OUTPATIENT_CLINIC_OR_DEPARTMENT_OTHER)
Admission: EM | Admit: 2016-05-12 | Discharge: 2016-05-12 | Disposition: A | Discharge status: Home / Self Care | Payer: Managed Care, Other (non HMO) | Attending: Emergency Medicine | Admitting: Emergency Medicine

## 2016-05-12 ENCOUNTER — Encounter (HOSPITAL_BASED_OUTPATIENT_CLINIC_OR_DEPARTMENT_OTHER): Payer: Self-pay | Admitting: *Deleted

## 2016-05-12 DIAGNOSIS — B350 Tinea barbae and tinea capitis: Secondary | ICD-10-CM | POA: Insufficient documentation

## 2016-05-12 DIAGNOSIS — R220 Localized swelling, mass and lump, head: Secondary | ICD-10-CM | POA: Diagnosis present

## 2016-05-12 MED ORDER — GRISEOFULVIN MICROSIZE 500 MG PO TABS: 500.0000 mg | ORAL_TABLET | Freq: Every day | ORAL | 0 refills | Status: AC

## 2016-05-12 NOTE — Discharge Instructions (Signed)
You were seen today for a bump on your scalp. It appears as though you have a kerion which comes from a fungus. This does not require any drainage. We have prescribed you an antifungal medication. You will take 1 pill a day for the next 6 weeks. Your hair should grow back over time. Please find a PCP and follow up with him/her. Take care!

## 2016-05-12 NOTE — ED Provider Notes (Signed)
[facility] Provider Note   CSN: [id number] Arrival date & time: 05/12/16  1850   History   Chief Complaint Chief Complaint  Patient presents with  . Skin Ulcer    HPI  Jeremy Joseph is a 30 y.o. male who presents with a lump on his head. Patient first noticed the lump on his head on October 4 after he got a haircut. He thought that the barber had accidentally cut off too much hair in the area at the time. Since then he has been waiting for the hair to grow back but it has not. The area where the hair was missing has a bump that is tender to palpation. Denies any pruritis. His wife tried to "pop" the area and only a small amount of clear fluid came out. He denies any fevers, chills, weight loss, or feeling ill. He notes that he frequently gets boils in his groin area and has been diagnosed with hidradenitis in the past.  Past Medical History:  Diagnosis Date  . Migraine     There are no active problems to display for this patient.   Past Surgical History:  Procedure Laterality Date  . HERNIA REPAIR       Home Medications    Prior to Admission medications   Medication Sig Start Date End Date Taking? Authorizing Provider  griseofulvin (GRIFULVIN V) 500 MG tablet Take 1 tablet (500 mg total) by mouth daily. 05/12/16 06/23/16  Beaulah Dinninghristina M Jasmene Goswami, MD  naproxen (NAPROSYN) 250 MG tablet Take 1 tablet (250 mg total) by mouth 2 (two) times daily with a meal. 07/31/15   Everlene FarrierWilliam Dansie, PA-C    Family History No family history on file.  Social History Social History  Substance Use Topics  . Smoking status: Never Smoker  . Smokeless tobacco: Never Used  . Alcohol use Yes     Allergies   Patient has no known allergies.   Review of Systems Review of Systems  Constitutional: Negative for activity change, fatigue and fever.  HENT: Negative for congestion, rhinorrhea, sneezing, sore throat and tinnitus.   Eyes: Negative for pain, discharge and itching.    Respiratory: Negative for chest tightness, shortness of breath and wheezing.   Cardiovascular: Negative.   Gastrointestinal: Negative.   Endocrine: Negative.   Genitourinary: Negative.   Musculoskeletal: Negative.   Skin:       Bump on head  Neurological: Negative.   Psychiatric/Behavioral: Negative.      Physical Exam Updated Vital Signs BP 131/92   Pulse (!) 57   Temp 97.6 F (36.4 C) (Oral)   Resp 18   Ht 6\' 3"  (1.905 m)   Wt 121.7 kg   SpO2 98%   BMI 33.54 kg/m   Physical Exam  Constitutional: He is oriented to person, place, and time. He appears well-developed and well-nourished.  HENT:  Head: Normocephalic and atraumatic.  Right Ear: External ear normal.  Left Ear: External ear normal.  Nose: Nose normal.  Mouth/Throat: Oropharynx is clear and moist.  Eyes: EOM are normal. Pupils are equal, round, and reactive to light. No scleral icterus.  Neck: Normal range of motion. Neck supple.  Cardiovascular: Normal rate, regular rhythm, normal heart sounds and intact distal pulses.   No murmur heard. Pulmonary/Chest: Effort normal and breath sounds normal. He has no wheezes.  Abdominal: Soft. Bowel sounds are normal. There is no tenderness.  Musculoskeletal: Normal range of motion. He exhibits no edema.  Neurological: He is alert and oriented to person,  place, and time. No sensory deficit.  Skin: Skin is warm. Capillary refill takes less than 2 seconds.  ~2 cm area of baldness on right occiput with underlying fluctuance, no induration, drainage or pus. Non erythematous.  Psychiatric: He has a normal mood and affect. His behavior is normal. Judgment and thought content normal.     ED Treatments / Results  Labs (all labs ordered are listed, but only abnormal results are displayed) Labs Reviewed - No data to display  EKG  EKG Interpretation None       Radiology No results found.  Procedures Procedures (including critical care time)  Medications Ordered in  ED Medications - No data to display   Initial Impression / Assessment and Plan / ED Course  I have reviewed the triage vital signs and the nursing notes.  Pertinent labs & imaging results that were available during my care of the patient were reviewed by me and considered in my medical decision making (see chart for details).  Clinical Course     Final Clinical Impressions(s) / ED Diagnoses   Final diagnoses:  Kerion, occipital scalp  Patient is a 30 year old male with history of migraines who presents with a one-month history of baldness on right occipital scalp associated with underlying skin bump. Vital signs stable upon presentation to the ED. Physical exam remarkable for approximately 2 cm kerion. Discussed diagnosis with patient and will treat with oral griseofulvin. He will obtain a PCP. Patient stable for discharge.   New Prescriptions New Prescriptions   GRISEOFULVIN (GRIFULVIN V) 500 MG TABLET    Take 1 tablet (500 mg total) by mouth daily.     Beaulah Dinninghristina M Anavictoria Wilk, MD 05/12/16 1952    Melene Planan Floyd, DO 05/12/16 1955

## 2016-05-12 NOTE — ED Triage Notes (Signed)
He has had a lesion on his scalp for a month. The hair has come off the area the size of a quarter and a lump on his scalp. He tried to express fluid from the site with small amount of clear liquid returns.

## 2016-07-13 ENCOUNTER — Ambulatory Visit (INDEPENDENT_AMBULATORY_CARE_PROVIDER_SITE_OTHER): Payer: No Typology Code available for payment source | Admitting: Urgent Care

## 2016-07-13 VITALS — BP 120/80 | HR 75 | Temp 97.7°F | Resp 16 | Ht 74.0 in | Wt 271.0 lb

## 2016-07-13 DIAGNOSIS — Z23 Encounter for immunization: Secondary | ICD-10-CM | POA: Diagnosis not present

## 2016-07-13 DIAGNOSIS — Z0000 Encounter for general adult medical examination without abnormal findings: Secondary | ICD-10-CM

## 2016-07-13 DIAGNOSIS — Z833 Family history of diabetes mellitus: Secondary | ICD-10-CM

## 2016-07-13 DIAGNOSIS — L0889 Other specified local infections of the skin and subcutaneous tissue: Secondary | ICD-10-CM

## 2016-07-13 DIAGNOSIS — D573 Sickle-cell trait: Secondary | ICD-10-CM | POA: Diagnosis not present

## 2016-07-13 DIAGNOSIS — D550 Anemia due to glucose-6-phosphate dehydrogenase [G6PD] deficiency: Secondary | ICD-10-CM | POA: Diagnosis not present

## 2016-07-13 DIAGNOSIS — K4090 Unilateral inguinal hernia, without obstruction or gangrene, not specified as recurrent: Secondary | ICD-10-CM | POA: Diagnosis not present

## 2016-07-13 DIAGNOSIS — R21 Rash and other nonspecific skin eruption: Secondary | ICD-10-CM

## 2016-07-13 DIAGNOSIS — D75A Glucose-6-phosphate dehydrogenase (G6PD) deficiency without anemia: Secondary | ICD-10-CM

## 2016-07-13 DIAGNOSIS — E669 Obesity, unspecified: Secondary | ICD-10-CM | POA: Diagnosis not present

## 2016-07-13 MED ORDER — TERBINAFINE HCL 250 MG PO TABS: 250.0000 mg | ORAL_TABLET | Freq: Every day | ORAL | 2 refills | Status: AC

## 2016-07-13 NOTE — Patient Instructions (Signed)

## 2016-07-13 NOTE — Progress Notes (Signed)
MRN: [id number]  Subjective:   Mr. Jeremy Joseph is a 31 y.o. male presenting for annual physical exam. 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 He served in [employer] in 2008 and that was the last time he received an annual exam apart from his biannual DOT exams. He is married, has 2 girls. Has good relationships at home, has a good support network. However, he admits that he drinks on average 6 liquor drinks over the weekend. Reports that this has been a point contention with his wife and father. He has actually cut back on his drinking. Denies needing an eye opener, feeling guilty about his drink or being agitated with his family when they ask him to stop drinking alcohol. Denies smoking cigarettes. He is trying to eat healthily and plans on exercising to lose weight.   Medical care team includes: PCP: No PCP Per Patient Vision: Wears contacts, plans on setting up an eye exam soon since he has not had an eye exam in 2 years. Dental: Will set up a dental cleaning, just obtained insurance. Specialists: Dermatology, history of recurrent skin infections. Appointment is set up for 07/20/2016.   Scalp rash - Reports 3 month history of persistent rash over his right posterior temporal scalp. Rash started out with swelling, tenderness. He presented to an ED in November 2017, was prescribed an antibiotic but was too expensive so patient did not fill the prescription. He wanted to have it lanced but the ED refused per patient. Thereafter, the patient ruptured the rash and states that it drained brown and yellow pus. The pain also improved but he has since noticed that he has a patch of hair that is not growing back. It also occasionally drains small amounts. He has not tried any additional medications for the rash and hair loss.  Health Maintenance: Needs flu and tdap updated.  [name] is not currently taking any medications. He has No Known  Allergies.  [name]  has a past medical history of Migraine. Also  has a past surgical history that includes Hernia repair.  Family history is positive for diabetes.  Review of Systems  Constitutional: Negative for chills, diaphoresis, fever, malaise/fatigue and weight loss.  HENT: Negative for congestion, ear discharge, ear pain, hearing loss, nosebleeds, sore throat and tinnitus.   Eyes: Negative for blurred vision, double vision, photophobia, pain, discharge and redness.  Respiratory: Negative for cough, shortness of breath and wheezing.   Cardiovascular: Negative for chest pain, palpitations and leg swelling.  Gastrointestinal: Negative for abdominal pain, blood in stool, constipation, diarrhea, nausea and vomiting.  Genitourinary: Negative for dysuria, flank pain, frequency, hematuria and urgency.  Musculoskeletal: Negative for back pain, joint pain and myalgias.  Skin: Positive for rash (as above). Negative for itching.  Neurological: Negative for dizziness, tingling, seizures, loss of consciousness, weakness and headaches.  Endo/Heme/Allergies: Negative for polydipsia.  Psychiatric/Behavioral: Negative for depression, hallucinations, memory loss, substance abuse and suicidal ideas. The patient is not nervous/anxious and does not have insomnia.    Objective:   Vitals: BP 120/80 (Patient Position: Sitting, Cuff Size: Large)   Pulse 75   Temp 97.7 F (36.5 C) (Oral)   Resp 16   Ht 6\' 2"  (1.88 m)   Wt 271 lb (122.9 kg)   SpO2 98%   BMI 34.79 kg/m    Visual Acuity Screening   Right eye Left eye Both eyes  Without correction:     With  correction: 20/20 20/25 20/25   Hearing Screening Comments: Whisper test done at 8 feet completed    Physical Exam  Constitutional: He is oriented to person, place, and time. He appears well-developed and well-nourished.  HENT:  Head:    TM's intact bilaterally, no effusions or erythema. Nasal turbinates pink and moist, nasal passages  patent. No sinus tenderness. Oropharynx clear, mucous membranes moist, dentition in good repair.  Eyes: Conjunctivae and EOM are normal. Pupils are equal, round, and reactive to light. Right eye exhibits no discharge. Left eye exhibits no discharge. No scleral icterus.  Neck: Normal range of motion. Neck supple. No thyromegaly present.  Cardiovascular: Normal rate, regular rhythm and intact distal pulses.  Exam reveals no gallop and no friction rub.   No murmur heard. Pulmonary/Chest: No stridor. No respiratory distress. He has no wheezes. He has no rales.  Abdominal: Soft. Bowel sounds are normal. He exhibits no distension and no mass. There is no tenderness. A hernia (right sided reducible groin hernia) is present.  Musculoskeletal: Normal range of motion. He exhibits no edema or tenderness.  Lymphadenopathy:    He has no cervical adenopathy.  Neurological: He is alert and oriented to person, place, and time. He has normal reflexes.  Skin: Skin is warm and dry. Rash noted. No erythema. No pallor.  Psychiatric: He has a normal mood and affect.   Assessment and Plan :   1. Annual physical exam - Medically stable, labs pending. Discussed healthy lifestyle, diet, exercise, preventative care, vaccinations, and addressed patient's concerns.   2. Need for prophylactic vaccination and inoculation against influenza - Flu Vaccine QUAD 36+ mos IM  3. Need for Tdap vaccination - Tdap vaccine greater than or equal to 7yo IM  4. Other specified local infections of the skin and subcutaneous tissue 5. Rash - Will treat as kerion with terbinafine for 3 months. Keep appointment with dermatology.  6. Family history of diabetes mellitus 7. Class 1 obesity without serious comorbidity in adult, unspecified BMI, unspecified obesity type - Encouraged efforts at weight loss.  8. G-6-PD deficiency (HCC) 9. Sickle cell trait (HCC) - Stable, labs pending.  10. Right groin hernia - Ambulatory referral to  General Surgery   Wallis BambergMario Saniah Schroeter, PA-C [facility] [phone] 07/13/2016  6:13 PM

## 2016-07-14 LAB — CBC
HEMOGLOBIN: 14.8 g/dL (ref 13.0–17.7)
Hematocrit: 45.3 % (ref 37.5–51.0)
MCH: 26 pg — AB (ref 26.6–33.0)
MCHC: 32.7 g/dL (ref 31.5–35.7)
MCV: 80 fL (ref 79–97)
Platelets: 359 10*3/uL (ref 150–379)
RBC: 5.69 x10E6/uL (ref 4.14–5.80)
RDW: 14.2 % (ref 12.3–15.4)
WBC: 7.8 10*3/uL (ref 3.4–10.8)

## 2016-07-14 LAB — CMP14+EGFR
A/G RATIO: 1.3 (ref 1.2–2.2)
ALBUMIN: 4.6 g/dL (ref 3.5–5.5)
ALK PHOS: 62 IU/L (ref 39–117)
ALT: 14 IU/L (ref 0–44)
AST: 19 IU/L (ref 0–40)
BILIRUBIN TOTAL: 0.5 mg/dL (ref 0.0–1.2)
BUN / CREAT RATIO: 10 (ref 9–20)
BUN: 12 mg/dL (ref 6–20)
CHLORIDE: 98 mmol/L (ref 96–106)
CO2: 22 mmol/L (ref 18–29)
Calcium: 9.6 mg/dL (ref 8.7–10.2)
Creatinine, Ser: 1.25 mg/dL (ref 0.76–1.27)
GFR calc Af Amer: 88 mL/min/{1.73_m2} (ref >59)
GFR calc non Af Amer: 76 mL/min/{1.73_m2} (ref >59)
GLOBULIN, TOTAL: 3.6 g/dL (ref 1.5–4.5)
GLUCOSE: 75 mg/dL (ref 65–99)
POTASSIUM: 4.5 mmol/L (ref 3.5–5.2)
SODIUM: 139 mmol/L (ref 134–144)
Total Protein: 8.2 g/dL (ref 6.0–8.5)

## 2016-07-14 LAB — HEMOGLOBIN A1C
ESTIMATED AVERAGE GLUCOSE: 85 mg/dL
HEMOGLOBIN A1C: 4.6 % — AB (ref 4.8–5.6)

## 2016-07-14 LAB — LIPID PANEL
CHOLESTEROL TOTAL: 163 mg/dL (ref 100–199)
Chol/HDL Ratio: 3.8 ratio units (ref 0.0–5.0)
HDL: 43 mg/dL (ref >39)
LDL Calculated: 102 mg/dL — ABNORMAL HIGH (ref 0–99)
Triglycerides: 88 mg/dL (ref 0–149)
VLDL Cholesterol Cal: 18 mg/dL (ref 5–40)

## 2016-07-20 ENCOUNTER — Encounter: Payer: Self-pay | Admitting: Physician Assistant

## 2016-10-15 ENCOUNTER — Ambulatory Visit: Payer: No Typology Code available for payment source | Admitting: Urgent Care

## 2016-10-16 ENCOUNTER — Encounter (HOSPITAL_COMMUNITY): Payer: Self-pay | Admitting: Emergency Medicine

## 2016-10-16 ENCOUNTER — Emergency Department (HOSPITAL_COMMUNITY)
Admission: EM | Admit: 2016-10-16 | Discharge: 2016-10-16 | Disposition: A | Discharge status: Home / Self Care | Payer: Managed Care, Other (non HMO) | Attending: Emergency Medicine | Admitting: Emergency Medicine

## 2016-10-16 ENCOUNTER — Emergency Department (HOSPITAL_COMMUNITY): Payer: Managed Care, Other (non HMO)

## 2016-10-16 DIAGNOSIS — M25512 Pain in left shoulder: Secondary | ICD-10-CM | POA: Insufficient documentation

## 2016-10-16 DIAGNOSIS — S161XXA Strain of muscle, fascia and tendon at neck level, initial encounter: Secondary | ICD-10-CM

## 2016-10-16 DIAGNOSIS — Y999 Unspecified external cause status: Secondary | ICD-10-CM | POA: Insufficient documentation

## 2016-10-16 DIAGNOSIS — Y929 Unspecified place or not applicable: Secondary | ICD-10-CM | POA: Insufficient documentation

## 2016-10-16 DIAGNOSIS — M79641 Pain in right hand: Secondary | ICD-10-CM | POA: Insufficient documentation

## 2016-10-16 DIAGNOSIS — Y939 Activity, unspecified: Secondary | ICD-10-CM | POA: Insufficient documentation

## 2016-10-16 MED ORDER — NAPROXEN 250 MG PO TABS
375.0000 mg | ORAL_TABLET | Freq: Once | ORAL | Status: AC
  Administered 2016-10-16: 375 mg via ORAL
  Filled 2016-10-16: qty 2

## 2016-10-16 MED ORDER — METHOCARBAMOL 500 MG PO TABS: 500.0000 mg | ORAL_TABLET | Freq: Two times a day (BID) | ORAL | 0 refills | Status: AC

## 2016-10-16 MED ORDER — NAPROXEN 375 MG PO TABS: 375.0000 mg | ORAL_TABLET | Freq: Two times a day (BID) | ORAL | 0 refills | Status: AC

## 2016-10-16 NOTE — ED Notes (Signed)
Pt taken to xray 

## 2016-10-16 NOTE — ED Notes (Signed)
Pt stated he didn't want wrist splint

## 2016-10-16 NOTE — Discharge Instructions (Signed)
imaging showed no fractures. Please rest, ice, elevate your right hand. Wear splint for comfort. Rest and ice to her left shoulder. Have given you naproxen for anti-inflammatory. Do not take any extra ibuprofen/Motrin/Aleve/Advil this medication. May take Tylenol. Take the Robaxin for muscle relaxers. Follow with orthopedics if symptoms do not improve. Return to ED if your symptoms worsen.
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
